# Patient Record
Sex: Female | Born: 1945 | Race: Black or African American | Hispanic: No | Marital: Single | Smoking: Former smoker
Health system: Southern US, Community
[De-identification: ages and names within clinical notes are randomized; demographics above are authoritative.]

---

## 2002-05-12 ENCOUNTER — Encounter: Payer: Self-pay | Admitting: Internal Medicine

## 2002-05-13 ENCOUNTER — Observation Stay (HOSPITAL_COMMUNITY): Admission: EM | Admit: 2002-05-13 | Discharge: 2002-05-14 | Payer: Self-pay | Admitting: Emergency Medicine

## 2002-05-14 ENCOUNTER — Encounter: Payer: Self-pay | Admitting: *Deleted

## 2006-12-11 ENCOUNTER — Other Ambulatory Visit: Admission: RE | Admit: 2006-12-11 | Discharge: 2006-12-11 | Payer: Self-pay | Admitting: Family Medicine

## 2007-01-11 ENCOUNTER — Encounter: Admission: RE | Admit: 2007-01-11 | Discharge: 2007-01-11 | Payer: Self-pay | Admitting: Family Medicine

## 2008-03-11 ENCOUNTER — Emergency Department (HOSPITAL_COMMUNITY): Admission: EM | Admit: 2008-03-11 | Discharge: 2008-03-11 | Payer: Self-pay | Admitting: Emergency Medicine

## 2010-12-23 ENCOUNTER — Emergency Department (HOSPITAL_COMMUNITY)
Admission: EM | Admit: 2010-12-23 | Discharge: 2010-12-23 | Payer: Self-pay | Source: Home / Self Care | Admitting: Emergency Medicine

## 2010-12-25 ENCOUNTER — Encounter: Payer: Self-pay | Admitting: Family Medicine

## 2011-04-21 NOTE — Discharge Summary (Signed)
Shirley Jackson  Patient:    Shirley Jackson, Shirley Jackson Visit Number: [id number] MRN: [id number]          Service Type: MED Location: 3W 0377 01 Attending Physician:  Shirley Jackson Dictated by:   Shirley Jackson, M.D. Admit Date:  05/12/2002 Discharge Date: 05/14/2002   CC:         Shirley Jackson, M.D.  Shirley Jackson Shirley Jackson, M.D.   Discharge Summary  DATE OF BIRTH:  March 27, 1946.  DISCHARGE DIAGNOSES: 1. Atypical chest pain.    a. Stress Cardiolite no evidence of ischemia.    b. Possible gastroesophageal reflux symptoms. 2. Borderline hypertension.    a. Follow as an outpatient. 3. Chronic tobacco use. 4. Possible chronic obstructive pulmonary disease.    a. No formal pulmonary function tests. 5. Abnormal nodules on chest x-ray.    a. Followup CT: Probable benign right third rib abnormality (see       discussion). 6. Postmenopausal.  DISCHARGE MEDICATIONS: 1. Prempro daily. 2. (NEW) Protonix 40 mg daily x1 month. 3. (NEW) Combivent meaty with spacer two puffs t.i.d. 4. (NEW) Nicotine patch 14 mg daily for 2 weeks then 7 mg daily for 2 weeks    then step (if you smoke remove the patch).  CONDITION UPON DISCHARGE:  Stable.  Blood pressure 135/91, heart rate 80, respiratory rate 16, oxygen saturations 98% on room air.  DISPOSITION:  Home.  RECOMMENDED ACTIVITY:  As tolerated.  RECOMMENDED DIET:  Low cholesterol.  Low salt.  SPECIAL INSTRUCTIONS:  Call or return if you have problems.  FOLLOWUP:  Dr. Maurice Jackson on Wednesday, May 21, 2002 at 10:40 a.m.Marland Kitchen  At that visit she will follow up on the patients TSH and also the chest CT done prior to admission.  CONSULTANT:  Cardiology - Shirley Jackson.  PROCEDURES: 1. Stress Cardiolite: No evidence of ischemia. 2. Chest x-ray (May 12, 2002): Borderline heart size without acute abnormality    of the chest.  Density on the right is noted with comparison to any prior    films suggested.  If these are not  available followup examination, possibly    including a limited CT might be helpful. 3. Limited chest CT (May 14, 2002): Three millimeter cuts were obtained    through the abnormal region.  There were no lung lesions.  There is,    however, a Jackson lesion of the anterior third rib seen on cuts 41-44.  The    lesion is approximately 1 cm in size.  It has heavily calcified components    and a Jackson soft tissue component.  This is likely a benign lesion such as    an osteochondroma.  However, I think it would be prudent to do a limited    CT followup in about 3 months to make sure it is not enlarging. 4. EKG: Normal sinus rhythm.  Left atrial enlargement.  Incomplete right    bundle branch block.  Nonspecific T-wave abnormality.  Jackson COURSE: #1 - ATYPICAL CHEST PAIN:  The patient is a 65 year old African-American female who presents with a 1-day history of substernal burning chest pain.  It seemed to be somewhat typical in that it was relieved with rest and started hurting while she was active.  She was admitted to the Jackson under rule out protocol and serial enzymes were obtained.  EKG was not normal as described above.  Serial cardiac enzymes were negative.  I did ask Dr. Fraser Jackson to see the patient.  A  stress Cardiolite was performed which showed no evidence of ischemia.  It is possible that this pain represents gastroesophageal reflux symptoms.  We have put her on a proton pump inhibitor and she will follow up with her regular physician.  #2 - BORDERLINE HYPERTENSION:  Follow up with Dr. Valentina Jackson.  #3 - ABNORMAL CHEST X-RAY:  A limited CT was done as above.  Recommended repeat in 3 months.  As stated above, it is suspected that this is a benign lesion but repeat CT scan is to ensure that there is no enlargement.  #4 - TOBACCO USE WITH PROBABLE CHRONIC OBSTRUCTIVE PULMONARY DISEASE:  I did start her on a Combivent inhaler and counseled on smoking cessation.  DISCHARGE AND  OTHER PERTINENT LABORATORIES:  Comprehensive metabolic panel normal.  Hemoglobin 12.9, WBC 5000, platelet count 287.  Total cholesterol 207.  Triglyceride 117, HDL 54, LDL 129.  TSH 1.129 (normal). Dictated by:   Shirley Jackson, M.D. Attending Physician:  Shirley Jackson DD:  06/04/02 TD:  06/07/02 Job: [id number]

## 2011-04-21 NOTE — Consult Note (Signed)
[facility]  Patient:    Shirley Jackson, Shirley Jackson Visit Number: [id number] MRN: [id number]          Service Type: MED Location: 3W 0377 01 Attending Physician:  Anastasio Auerbach Dictated by:   Meade Maw, M.D. Proc. Date: 05/13/02 Admit Date:  05/12/2002 Discharge Date: 05/14/2002   CC:         Anastasio Auerbach, M.D.  Shirley Jackson, M.D.   Consultation Report  REFERRING PHYSICIAN:  Anastasio Auerbach, M.D.  INDICATION FOR CONSULTATION:  Chest pain.  HISTORY OF PRESENT ILLNESS:  Shirley Jackson is a 65 year old African-American female, who noted an episode of chest pain/burning after walking for approximately one-half mile at work.  The chest pain itself resolved in approximately 10 minutes spontaneously, but she was left with a residual burning and not feeling quite right.  This prompted her to come to the emergency room for further evaluation.  Her associated symptoms included diaphoresis, nausea, but no vomiting, no shortness of breath.  The pain was nonradiating.  Her coronary risk factors are significant for age, tobacco use, family history. Cholesterol status is unknown.  PAST MEDICAL HISTORY:  Migraines.  MEDICATIONS PRIOR TO ARRIVAL:  Prempro and Excedrin.  CURRENT MEDICATIONS: 1. Nicotine patch. 2. Protonix 40 mg q.d. 3. Maalox 30 cc p.o. q.6h. 4. Sublingual nitroglycerin. 5. Prempro. 6. Baby aspirin.  ALLERGIES:  None.  PAST SURGICAL HISTORY:  None.  SOCIAL HISTORY:  She has been widowed for approximately 12 years.  She lives with her daughter, works for a [employer] which requires much reaching, extension, and bending.  She continues to smoke approximately one-half pack-per-day.  No history of alcohol or illicit drug use.  FAMILY HISTORY:  Significant for brother with coronary artery disease in his early 33s.  REVIEW OF SYSTEMS:  There has been no orthopnea, no tachyarrhythmia, no PND, no palpitations, no pedal edema, no fevers, no  chills.  PHYSICAL EXAMINATION:  GENERAL:  A middle-aged, African-American female in no acute distress, pleasant.  Shirley Jackson is appropriate.  VITAL SIGNS:  Initial blood pressure on admission was 162/77.  Blood pressure this a.m. is 156/76.  Heart rate is 60-70.  She is afebrile.  O2 saturation is 99% on room air.  HEENT:  Unremarkable.  NECK:  She has good carotid upstrokes.  No carotid bruits.  Thyroid is not palpable.  PULMONARY:  Breath sounds are equal and clear to auscultation.  Negative use of accessory muscles.  CARDIOVASCULAR:  Normal S1, normal S2, regular rate and rhythm, no rubs, murmurs, or gallops are noted.  ABDOMEN:  Soft, nontender.  No unusual bruits or pulsations are noted.  EXTREMITIES:  No peripheral edema.  SKIN:  Warm and dry.  NEUROLOGIC:  Nonfocal throughout.  Motor is 5/5.  LABORATORY DATA:  Her initial ECG reveals a sinus rhythm with nonspecific ST changes.  Anterior ventricular conduction delay consistent with a right bundle-branch block.  ECG this a.m. reveals a normal sinus rhythm.  There are T waves noted in the precordial leads.  Her serial cardiac enzymes are negative.  Her white count is 12.3, hematocrit 37, platelet count 273.  Potassium 3.5, creatinine .9.  Her PT was 12.2.  Chest x-ray reveals heart size at upper limits of normal, questionable density in the right middle lobe.  No evidence of pulmonary edema.  IMPRESSION: 52. A 64 year old, African-American female, with significant risk factors for    coronary artery disease.  She has nonspecific ST changes noted.  The T wave  changes are more prominent this morning.  Her serial cardiac enzymes are    negative.  In view of her risk factors and ECG, would proceed with stress    Cardiolite for further evaluation.  I agree with your current plan of care.    Lovenox has been added to her regimen in view of her intermediate risk    status. 2. Tobacco abuse.  Smoking cessation was  discussed at length with the patient.    She is aware of the comorbid association with ongoing tobacco use. 3. Right middle lobe density.  In view of her tobacco history, may want to    consider a CT scan.  Further recommendations will be pending the outcome of    her stress Cardiolite.  Thank you for allowing me to participate in the care of your patient. Dictated by:   Meade Maw, M.D. Attending Physician:  Anastasio Auerbach DD:  05/13/02 TD:  05/15/02 Job: [id number]

## 2015-04-05 DIAGNOSIS — E780 Pure hypercholesterolemia: Secondary | ICD-10-CM | POA: Diagnosis not present

## 2015-04-05 DIAGNOSIS — M150 Primary generalized (osteo)arthritis: Secondary | ICD-10-CM | POA: Diagnosis not present

## 2015-04-05 DIAGNOSIS — I10 Essential (primary) hypertension: Secondary | ICD-10-CM | POA: Diagnosis not present

## 2015-04-05 DIAGNOSIS — J301 Allergic rhinitis due to pollen: Secondary | ICD-10-CM | POA: Diagnosis not present

## 2015-04-05 DIAGNOSIS — J309 Allergic rhinitis, unspecified: Secondary | ICD-10-CM | POA: Diagnosis not present

## 2015-04-05 DIAGNOSIS — R739 Hyperglycemia, unspecified: Secondary | ICD-10-CM | POA: Diagnosis not present

## 2015-05-04 ENCOUNTER — Other Ambulatory Visit: Payer: Self-pay | Admitting: Family Medicine

## 2015-05-04 ENCOUNTER — Ambulatory Visit
Admission: RE | Admit: 2015-05-04 | Discharge: 2015-05-04 | Disposition: A | Discharge status: Home / Self Care | Payer: Commercial Managed Care - HMO | Source: Ambulatory Visit | Attending: Family Medicine | Admitting: Family Medicine

## 2015-05-04 DIAGNOSIS — R0781 Pleurodynia: Secondary | ICD-10-CM | POA: Diagnosis not present

## 2015-05-04 DIAGNOSIS — M546 Pain in thoracic spine: Secondary | ICD-10-CM

## 2015-10-11 DIAGNOSIS — E7800 Pure hypercholesterolemia, unspecified: Secondary | ICD-10-CM | POA: Diagnosis not present

## 2015-10-11 DIAGNOSIS — M150 Primary generalized (osteo)arthritis: Secondary | ICD-10-CM | POA: Diagnosis not present

## 2015-10-11 DIAGNOSIS — Z23 Encounter for immunization: Secondary | ICD-10-CM | POA: Diagnosis not present

## 2015-10-11 DIAGNOSIS — I10 Essential (primary) hypertension: Secondary | ICD-10-CM | POA: Diagnosis not present

## 2015-10-11 DIAGNOSIS — J309 Allergic rhinitis, unspecified: Secondary | ICD-10-CM | POA: Diagnosis not present

## 2015-10-11 DIAGNOSIS — L309 Dermatitis, unspecified: Secondary | ICD-10-CM | POA: Diagnosis not present

## 2016-04-10 DIAGNOSIS — M150 Primary generalized (osteo)arthritis: Secondary | ICD-10-CM | POA: Diagnosis not present

## 2016-04-10 DIAGNOSIS — L309 Dermatitis, unspecified: Secondary | ICD-10-CM | POA: Diagnosis not present

## 2016-04-10 DIAGNOSIS — I10 Essential (primary) hypertension: Secondary | ICD-10-CM | POA: Diagnosis not present

## 2016-04-10 DIAGNOSIS — E7800 Pure hypercholesterolemia, unspecified: Secondary | ICD-10-CM | POA: Diagnosis not present

## 2016-10-13 DIAGNOSIS — M150 Primary generalized (osteo)arthritis: Secondary | ICD-10-CM | POA: Diagnosis not present

## 2016-10-13 DIAGNOSIS — I10 Essential (primary) hypertension: Secondary | ICD-10-CM | POA: Diagnosis not present

## 2016-10-13 DIAGNOSIS — E7800 Pure hypercholesterolemia, unspecified: Secondary | ICD-10-CM | POA: Diagnosis not present

## 2017-05-15 DIAGNOSIS — E7800 Pure hypercholesterolemia, unspecified: Secondary | ICD-10-CM | POA: Diagnosis not present

## 2017-05-15 DIAGNOSIS — M150 Primary generalized (osteo)arthritis: Secondary | ICD-10-CM | POA: Diagnosis not present

## 2017-05-15 DIAGNOSIS — H6120 Impacted cerumen, unspecified ear: Secondary | ICD-10-CM | POA: Diagnosis not present

## 2017-05-15 DIAGNOSIS — L309 Dermatitis, unspecified: Secondary | ICD-10-CM | POA: Diagnosis not present

## 2017-05-15 DIAGNOSIS — I10 Essential (primary) hypertension: Secondary | ICD-10-CM | POA: Diagnosis not present

## 2017-12-11 DIAGNOSIS — L309 Dermatitis, unspecified: Secondary | ICD-10-CM | POA: Diagnosis not present

## 2017-12-11 DIAGNOSIS — I10 Essential (primary) hypertension: Secondary | ICD-10-CM | POA: Diagnosis not present

## 2017-12-11 DIAGNOSIS — M150 Primary generalized (osteo)arthritis: Secondary | ICD-10-CM | POA: Diagnosis not present

## 2017-12-11 DIAGNOSIS — E7800 Pure hypercholesterolemia, unspecified: Secondary | ICD-10-CM | POA: Diagnosis not present

## 2018-06-10 DIAGNOSIS — M150 Primary generalized (osteo)arthritis: Secondary | ICD-10-CM | POA: Diagnosis not present

## 2018-06-10 DIAGNOSIS — E7800 Pure hypercholesterolemia, unspecified: Secondary | ICD-10-CM | POA: Diagnosis not present

## 2018-06-10 DIAGNOSIS — I10 Essential (primary) hypertension: Secondary | ICD-10-CM | POA: Diagnosis not present

## 2018-06-10 DIAGNOSIS — L309 Dermatitis, unspecified: Secondary | ICD-10-CM | POA: Diagnosis not present

## 2018-08-20 DIAGNOSIS — M1711 Unilateral primary osteoarthritis, right knee: Secondary | ICD-10-CM | POA: Diagnosis not present

## 2018-08-20 DIAGNOSIS — M1712 Unilateral primary osteoarthritis, left knee: Secondary | ICD-10-CM | POA: Diagnosis not present

## 2018-09-09 DIAGNOSIS — J069 Acute upper respiratory infection, unspecified: Secondary | ICD-10-CM | POA: Diagnosis not present

## 2018-12-20 DIAGNOSIS — E7800 Pure hypercholesterolemia, unspecified: Secondary | ICD-10-CM | POA: Diagnosis not present

## 2018-12-20 DIAGNOSIS — I10 Essential (primary) hypertension: Secondary | ICD-10-CM | POA: Diagnosis not present

## 2018-12-20 DIAGNOSIS — L309 Dermatitis, unspecified: Secondary | ICD-10-CM | POA: Diagnosis not present

## 2018-12-20 DIAGNOSIS — M150 Primary generalized (osteo)arthritis: Secondary | ICD-10-CM | POA: Diagnosis not present

## 2018-12-26 DIAGNOSIS — H5210 Myopia, unspecified eye: Secondary | ICD-10-CM | POA: Diagnosis not present

## 2019-06-11 DIAGNOSIS — E7800 Pure hypercholesterolemia, unspecified: Secondary | ICD-10-CM | POA: Diagnosis not present

## 2019-06-11 DIAGNOSIS — I10 Essential (primary) hypertension: Secondary | ICD-10-CM | POA: Diagnosis not present

## 2019-06-11 DIAGNOSIS — L309 Dermatitis, unspecified: Secondary | ICD-10-CM | POA: Diagnosis not present

## 2019-06-11 DIAGNOSIS — M150 Primary generalized (osteo)arthritis: Secondary | ICD-10-CM | POA: Diagnosis not present

## 2019-06-12 DIAGNOSIS — E7800 Pure hypercholesterolemia, unspecified: Secondary | ICD-10-CM | POA: Diagnosis not present

## 2019-07-12 DIAGNOSIS — Z20828 Contact with and (suspected) exposure to other viral communicable diseases: Secondary | ICD-10-CM | POA: Diagnosis not present

## 2019-11-08 DIAGNOSIS — M25562 Pain in left knee: Secondary | ICD-10-CM | POA: Diagnosis not present

## 2019-11-22 DIAGNOSIS — Z20828 Contact with and (suspected) exposure to other viral communicable diseases: Secondary | ICD-10-CM | POA: Diagnosis not present

## 2019-12-09 DIAGNOSIS — I10 Essential (primary) hypertension: Secondary | ICD-10-CM | POA: Diagnosis not present

## 2019-12-09 DIAGNOSIS — M150 Primary generalized (osteo)arthritis: Secondary | ICD-10-CM | POA: Diagnosis not present

## 2019-12-09 DIAGNOSIS — E7800 Pure hypercholesterolemia, unspecified: Secondary | ICD-10-CM | POA: Diagnosis not present

## 2019-12-09 DIAGNOSIS — L309 Dermatitis, unspecified: Secondary | ICD-10-CM | POA: Diagnosis not present

## 2019-12-09 DIAGNOSIS — D179 Benign lipomatous neoplasm, unspecified: Secondary | ICD-10-CM | POA: Diagnosis not present

## 2019-12-31 DIAGNOSIS — R519 Headache, unspecified: Secondary | ICD-10-CM | POA: Diagnosis not present

## 2020-02-11 DIAGNOSIS — M1711 Unilateral primary osteoarthritis, right knee: Secondary | ICD-10-CM | POA: Diagnosis not present

## 2020-04-15 DIAGNOSIS — M25561 Pain in right knee: Secondary | ICD-10-CM | POA: Diagnosis not present

## 2020-04-15 DIAGNOSIS — M25562 Pain in left knee: Secondary | ICD-10-CM | POA: Diagnosis not present

## 2020-04-27 DIAGNOSIS — M25562 Pain in left knee: Secondary | ICD-10-CM | POA: Diagnosis not present

## 2020-05-04 DIAGNOSIS — S83281A Other tear of lateral meniscus, current injury, right knee, initial encounter: Secondary | ICD-10-CM | POA: Diagnosis not present

## 2020-05-14 DIAGNOSIS — M11262 Other chondrocalcinosis, left knee: Secondary | ICD-10-CM | POA: Diagnosis not present

## 2020-05-14 DIAGNOSIS — M23262 Derangement of other lateral meniscus due to old tear or injury, left knee: Secondary | ICD-10-CM | POA: Diagnosis not present

## 2020-05-14 DIAGNOSIS — G8918 Other acute postprocedural pain: Secondary | ICD-10-CM | POA: Diagnosis not present

## 2020-05-14 DIAGNOSIS — M23232 Derangement of other medial meniscus due to old tear or injury, left knee: Secondary | ICD-10-CM | POA: Diagnosis not present

## 2020-05-14 DIAGNOSIS — S83281A Other tear of lateral meniscus, current injury, right knee, initial encounter: Secondary | ICD-10-CM | POA: Diagnosis not present

## 2020-06-08 DIAGNOSIS — E7800 Pure hypercholesterolemia, unspecified: Secondary | ICD-10-CM | POA: Diagnosis not present

## 2020-06-08 DIAGNOSIS — I10 Essential (primary) hypertension: Secondary | ICD-10-CM | POA: Diagnosis not present

## 2020-06-08 DIAGNOSIS — M25562 Pain in left knee: Secondary | ICD-10-CM | POA: Diagnosis not present

## 2020-06-08 DIAGNOSIS — M150 Primary generalized (osteo)arthritis: Secondary | ICD-10-CM | POA: Diagnosis not present

## 2020-06-08 DIAGNOSIS — K219 Gastro-esophageal reflux disease without esophagitis: Secondary | ICD-10-CM | POA: Diagnosis not present

## 2020-11-25 DIAGNOSIS — Z03818 Encounter for observation for suspected exposure to other biological agents ruled out: Secondary | ICD-10-CM | POA: Diagnosis not present

## 2020-11-25 DIAGNOSIS — Z20822 Contact with and (suspected) exposure to covid-19: Secondary | ICD-10-CM | POA: Diagnosis not present

## 2020-12-09 DIAGNOSIS — J309 Allergic rhinitis, unspecified: Secondary | ICD-10-CM | POA: Diagnosis not present

## 2020-12-09 DIAGNOSIS — I10 Essential (primary) hypertension: Secondary | ICD-10-CM | POA: Diagnosis not present

## 2020-12-09 DIAGNOSIS — M150 Primary generalized (osteo)arthritis: Secondary | ICD-10-CM | POA: Diagnosis not present

## 2020-12-09 DIAGNOSIS — E7800 Pure hypercholesterolemia, unspecified: Secondary | ICD-10-CM | POA: Diagnosis not present

## 2020-12-09 DIAGNOSIS — D179 Benign lipomatous neoplasm, unspecified: Secondary | ICD-10-CM | POA: Diagnosis not present

## 2021-05-05 DIAGNOSIS — H5713 Ocular pain, bilateral: Secondary | ICD-10-CM | POA: Diagnosis not present

## 2021-05-10 DIAGNOSIS — H539 Unspecified visual disturbance: Secondary | ICD-10-CM | POA: Diagnosis not present

## 2021-05-25 ENCOUNTER — Other Ambulatory Visit: Payer: Self-pay | Admitting: Family Medicine

## 2021-05-25 DIAGNOSIS — H5310 Unspecified subjective visual disturbances: Secondary | ICD-10-CM

## 2021-05-25 DIAGNOSIS — R42 Dizziness and giddiness: Secondary | ICD-10-CM

## 2021-06-03 ENCOUNTER — Ambulatory Visit
Admission: RE | Admit: 2021-06-03 | Discharge: 2021-06-03 | Disposition: A | Discharge status: Home / Self Care | Payer: Medicare HMO | Source: Ambulatory Visit | Attending: Family Medicine | Admitting: Family Medicine

## 2021-06-03 DIAGNOSIS — R0989 Other specified symptoms and signs involving the circulatory and respiratory systems: Secondary | ICD-10-CM | POA: Diagnosis not present

## 2021-06-03 DIAGNOSIS — I6523 Occlusion and stenosis of bilateral carotid arteries: Secondary | ICD-10-CM | POA: Diagnosis not present

## 2021-06-03 DIAGNOSIS — E782 Mixed hyperlipidemia: Secondary | ICD-10-CM | POA: Diagnosis not present

## 2021-06-03 DIAGNOSIS — R42 Dizziness and giddiness: Secondary | ICD-10-CM

## 2021-06-03 DIAGNOSIS — H5310 Unspecified subjective visual disturbances: Secondary | ICD-10-CM

## 2021-07-01 DIAGNOSIS — D179 Benign lipomatous neoplasm, unspecified: Secondary | ICD-10-CM | POA: Diagnosis not present

## 2021-07-01 DIAGNOSIS — Z0000 Encounter for general adult medical examination without abnormal findings: Secondary | ICD-10-CM | POA: Diagnosis not present

## 2021-07-01 DIAGNOSIS — Z111 Encounter for screening for respiratory tuberculosis: Secondary | ICD-10-CM | POA: Diagnosis not present

## 2021-07-01 DIAGNOSIS — I10 Essential (primary) hypertension: Secondary | ICD-10-CM | POA: Diagnosis not present

## 2021-07-01 DIAGNOSIS — E7800 Pure hypercholesterolemia, unspecified: Secondary | ICD-10-CM | POA: Diagnosis not present

## 2021-07-01 DIAGNOSIS — J309 Allergic rhinitis, unspecified: Secondary | ICD-10-CM | POA: Diagnosis not present

## 2021-07-01 DIAGNOSIS — M150 Primary generalized (osteo)arthritis: Secondary | ICD-10-CM | POA: Diagnosis not present

## 2021-08-24 DIAGNOSIS — H5212 Myopia, left eye: Secondary | ICD-10-CM | POA: Diagnosis not present

## 2021-11-04 DIAGNOSIS — B9689 Other specified bacterial agents as the cause of diseases classified elsewhere: Secondary | ICD-10-CM | POA: Diagnosis not present

## 2021-11-04 DIAGNOSIS — J019 Acute sinusitis, unspecified: Secondary | ICD-10-CM | POA: Diagnosis not present

## 2021-11-04 DIAGNOSIS — H00014 Hordeolum externum left upper eyelid: Secondary | ICD-10-CM | POA: Diagnosis not present

## 2022-01-25 ENCOUNTER — Other Ambulatory Visit: Payer: Self-pay | Admitting: Family Medicine

## 2022-01-25 ENCOUNTER — Ambulatory Visit
Admission: RE | Admit: 2022-01-25 | Discharge: 2022-01-25 | Disposition: A | Discharge status: Home / Self Care | Payer: Medicare HMO | Source: Ambulatory Visit | Attending: Family Medicine | Admitting: Family Medicine

## 2022-01-25 DIAGNOSIS — E7800 Pure hypercholesterolemia, unspecified: Secondary | ICD-10-CM | POA: Diagnosis not present

## 2022-01-25 DIAGNOSIS — I10 Essential (primary) hypertension: Secondary | ICD-10-CM | POA: Diagnosis not present

## 2022-01-25 DIAGNOSIS — I700 Atherosclerosis of aorta: Secondary | ICD-10-CM | POA: Diagnosis not present

## 2022-01-25 DIAGNOSIS — M150 Primary generalized (osteo)arthritis: Secondary | ICD-10-CM | POA: Diagnosis not present

## 2022-01-25 DIAGNOSIS — R0789 Other chest pain: Secondary | ICD-10-CM

## 2022-01-25 DIAGNOSIS — R0602 Shortness of breath: Secondary | ICD-10-CM | POA: Diagnosis not present

## 2022-01-25 DIAGNOSIS — R918 Other nonspecific abnormal finding of lung field: Secondary | ICD-10-CM | POA: Diagnosis not present

## 2022-01-25 DIAGNOSIS — J309 Allergic rhinitis, unspecified: Secondary | ICD-10-CM | POA: Diagnosis not present

## 2022-02-06 ENCOUNTER — Other Ambulatory Visit: Payer: Self-pay

## 2022-02-06 ENCOUNTER — Encounter: Payer: Self-pay | Admitting: Internal Medicine

## 2022-02-06 ENCOUNTER — Ambulatory Visit: Payer: Medicare HMO | Admitting: Internal Medicine

## 2022-02-06 VITALS — BP 122/70 | HR 82 | Ht 67.5 in | Wt 162.0 lb

## 2022-02-06 DIAGNOSIS — R072 Precordial pain: Secondary | ICD-10-CM

## 2022-02-06 DIAGNOSIS — R079 Chest pain, unspecified: Secondary | ICD-10-CM | POA: Diagnosis not present

## 2022-02-06 DIAGNOSIS — E785 Hyperlipidemia, unspecified: Secondary | ICD-10-CM

## 2022-02-06 DIAGNOSIS — I779 Disorder of arteries and arterioles, unspecified: Secondary | ICD-10-CM | POA: Diagnosis not present

## 2022-02-06 DIAGNOSIS — I10 Essential (primary) hypertension: Secondary | ICD-10-CM | POA: Diagnosis not present

## 2022-02-06 MED ORDER — ASPIRIN EC 81 MG PO TBEC: 81.0000 mg | DELAYED_RELEASE_TABLET | Freq: Every day | ORAL | 3 refills | Status: AC

## 2022-02-06 MED ORDER — METOPROLOL TARTRATE 100 MG PO TABS: 100.0000 mg | ORAL_TABLET | Freq: Once | ORAL | 0 refills | Status: DC

## 2022-02-06 NOTE — Progress Notes (Signed)
?Cardiology Office Note:   ? ?Date:  02/06/2022  ? ?ID:  Shirley Jackson, DOB 12/27/1945, MRN [id number] ? ?PCP:  Alroy Dust, L.Marlou Sa, MD  ? ?[facility] ?Cardiologist:  Lenna Sciara, MD ?Referring MD: Aurea Graff.Marlou Sa, MD  ? ?Chief Complaint/Reason for Referral:  Chest pain ? ?ASSESSMENT:   ? ?Chest pain, unspecified type ? ?Bilateral carotid artery disease, unspecified type ([place]) ? ?Hyperlipidemia, unspecified hyperlipidemia type ? ?Hypertension, unspecified type ? ?Precordial pain ? ?PLAN:   ? ?In order of problems listed above: ? ?1.  We will obtain a coronary CTA and echocardiogram to evaluate further.  If the patient has mild obstructive coronary artery disease, they will require a statin (with goal LDL < 70) and aspirin, if they have high-grade disease we will need to consider optimal medical therapy and if symptoms are refractory to medical therapy, then a cardiac catheterization with possible PCI will be pursued to alleviate symptoms.  If they have high risk disease we will proceed directly to cardiac catheterization.  Follow up 6 months or earlier if needed. ?2.  Goal LDL is less than 70, continue aspirin and statin. ?3.  See #2 above. ?4.  Goal blood pressures less than 130/80 mmHg. ?5.  See number 1 above. ? ?     ? ?  ? ?Dispo:  Return in about 6 months (around 08/09/2022).  ?  ? ?Medication Adjustments/Labs and Tests Ordered: ?Current medicines are reviewed at length with the patient today.  Concerns regarding medicines are outlined above.  ? ?Tests Ordered: ?No orders of the defined types were placed in this encounter. ? ? ?Medication Changes: ?No orders of the defined types were placed in this encounter. ? ? ?History of Present Illness:   ? ?FOCUSED PROBLEM LIST:   ?1.  Hypertension ?2.  Hyperlipidemia ?3.  Osteoarthritis ?4.  Mild carotid atherosclerosis on vascular ultrasound 2022 ? ?The patient is a 76 y.o. female with the indicated medical history here for recommendations regarding chest  pain.  The patient was seen by her primary care provider recently and reported chest tightness.  Shortness of breath with exertion of several months duration.  EKG was done which I reviewed which demonstrated sinus rhythm and nonspecific ST and T wave changes. ? ?She tells me that this episode of chest discomfort happened about a week and a half ago.  She typically eats pretty late at night and she tells me she was in bed.  She was not sleeping.  She noticed chest discomfort.  She is unable to describe this to me very well.  She denies any chest discomfort with exertion.  She does get some shortness of breath with exertion at times.  She denies any paroxysmal nocturnal dyspnea, peripheral edema, signs or symptoms stroke, severe bleeding, or presyncope or syncope.  She has required no hospitalizations or emergency room visits. ? ?She did quit smoking about 5 years ago and happy to report she continues to abstain from tobacco. ?    ?  ?Previous Medical History: ?History reviewed. No pertinent past medical history. ? ? ?Current Medications: ?Current Meds  ?Medication Sig  ? amLODipine (NORVASC) 10 MG tablet Take 1 tablet by mouth daily.  ? cetirizine (ZYRTEC) 10 MG tablet Take 1 tablet by mouth daily.  ? diclofenac Sodium (VOLTAREN) 1 % GEL Apply 2 g topically as needed.  ? fluticasone (FLONASE) 50 MCG/ACT nasal spray Place 1 spray into both nostrils as needed.  ? rosuvastatin (CRESTOR) 10 MG tablet Take 1 tablet by mouth  daily.  ? traMADol (ULTRAM) 50 MG tablet Take 1 tablet by mouth daily.  ?  ? ?Allergies:    ?Patient has no allergy information on record.  ? ?Social History:   ?Social History  ? ?Tobacco Use  ? Smoking status: Former  ?  Types: Cigarettes  ? Smokeless tobacco: Never  ?  ? ?Family Hx: ?Family History  ?Problem Relation Age of Onset  ? Stroke Father   ? Heart attack Sister   ? Heart attack Brother   ?  ? ?Review of Systems:   ?Please see the history of present illness.    ?All other systems reviewed  and are negative. ?  ? ? ?EKGs/Labs/Other Test Reviewed:   ? ?EKG: Outside EKG demonstrates normal sinus rhythm with nonspecific ST and T wave changes ? ?Prior CV studies: ? ?Carotid U/S 2022 ?Bilateral carotid atherosclerosis. Negative for stenosis. Degree of ?narrowing less than 50% bilaterally by ultrasound criteria. ? ?Lexiscan 2003 ?CLINICAL DATA:    CHEST PAIN  ?NM MYOCARDIAL PERFUSION STUDY  ?THE PATIENT UNDERWENT A SINGLE DAY TREADMILL STRESS CARDIOLITE STUDY WITH 10.0 MCI OF TC-65M  ?SESTAMIBI INJECTED PRIOR TO REST IMAGES AND 30.0 MCI PRIOR TO STRESS IMAGES.  ?MYOCARDIAL PERFUSION WITH SPECT:    NO REVERSIBLE OR IRREVERSIBLE DEFECTS ARE SEEN TO SUGGEST  ?ISCHEMIA OR INFARCTION.  ?EJECTION FRACTION:    THE EJECTION FRACTION IS 35% WITH END-DIASTOLIC VOLUME OF 69 ML AND END-  ?SYSTOLIC VOLUME OF 30 ML.  ?WALL MOTION ANALYSIS:    THERE IS NORMAL WALL MOTION AND WALL THICKENING IN ALL REGIONS.  ?IMPRESSION  ?NO SIGNIFICANT ABNORMALITY.  EJECTION FRACTION 57%. ? ?Imaging studies that I have independently reviewed today: Carotid u/s ? ?Recent Labs: ?External labs demonstrate a sodium 141 potassium 3.9 BUN 14 creatinine 1.13 glucose 83 cholesterol 134 triglycerides 88 HDL 54 LDL 63 LFTs within normal limits ? ?Risk Assessment/Calculations:   ? ?  ?    ? ?Physical Exam:   ? ?VS:  BP 122/70   Pulse 82   Ht 5' 7.5" (1.715 m)   Wt 162 lb (73.5 kg)   SpO2 96%   BMI 25.00 kg/m?    ?Wt Readings from Last 3 Encounters:  ?02/06/22 162 lb (73.5 kg)  ?  ?GENERAL:  No apparent distress, AOx3 ?HEENT:  No carotid bruits, +2 carotid impulses, no scleral icterus ?CAR: RRR no murmurs, gallops, rubs, or thrills ?RES:  Clear to auscultation bilaterally ?ABD:  Soft, nontender, nondistended, positive bowel sounds x 4 ?VASC:  +2 radial pulses, +2 carotid pulses, palpable pedal pulses ?NEURO:  CN 2-12 grossly intact; motor and sensory grossly intact ?PSYCH:  No active depression or anxiety ?EXT:  No edema, ecchymosis, or  cyanosis ? ?Signed, ?Early Osmond, MD  ?02/06/2022 8:21 AM    ?[facility] ?[place], [place], [place]  [address] ?Phone: [phone]; Fax: [phone]  ? ?Note:  This document was prepared using Dragon voice recognition software and may include unintentional dictation errors. ?

## 2022-02-06 NOTE — Patient Instructions (Signed)
Medication Instructions:  ?Your physician has recommended you make the following change in your medication:  ?1.) start aspirin 81 mg - one tablet daily ? ?*If you need a refill on your cardiac medications before your next appointment, please call your pharmacy* ? ? ?Lab Work: ?None today. ?If you have labs (blood work) drawn today and your tests are completely normal, you will receive your results only by: ?MyChart Message (if you have MyChart) OR ?A paper copy in the mail ?If you have any lab test that is abnormal or we need to change your treatment, we will call you to review the results. ? ? ?Testing/Procedures: ?Cardiac CTA - see instructions below. ? ? ?Follow-Up: ?At [facility], you and your health needs are our priority.  As part of our continuing mission to provide you with exceptional heart care, we have created designated Provider Care Teams.  These Care Teams include your primary Cardiologist (physician) and Advanced Practice Providers (APPs -  Physician Assistants and Nurse Practitioners) who all work together to provide you with the care you need, when you need it. ? ?We recommend signing up for the patient portal called "MyChart".  Sign up information is provided on this After Visit Summary.  MyChart is used to connect with patients for Virtual Visits (Telemedicine).  Patients are able to view lab/test results, encounter notes, upcoming appointments, etc.  Non-urgent messages can be sent to your provider as well.   ?To learn more about what you can do with MyChart, go to [email or web address].   ? ?Your next appointment:   ?6 month(s) ? ?The format for your next appointment:   ?In Person ? ?Provider:   ?Early Osmond, MD   ? ? ?Other Instructions ? ? ?Your cardiac CT will be scheduled at  ?[facility] ?[address] ?[place], [place] [address] ?[phone] ? ? ?Please arrive at the [facility] and Children's Entrance (Entrance C2) of [facility] 30 minutes prior to test  start time. ?You can use the FREE valet parking offered at entrance C (encouraged to control the heart rate for the test)  ?Proceed to the [facility] Radiology Department (first floor) to check-in and test prep. ? ?All radiology patients and guests should use entrance C2 at [facility], accessed from [facility], even though the hospital's physical address listed is [address]. ? ? ? ? ?Please follow these instructions carefully (unless otherwise directed): ? ? ?On the Night Before the Test: ?Be sure to Drink plenty of water. ?Do not consume any caffeinated/decaffeinated beverages or chocolate 12 hours prior to your test. ?Do not take any antihistamines 12 hours prior to your test. ? ?On the Day of the Test: ?Drink plenty of water until 1 hour prior to the test. ?Do not eat any food 4 hours prior to the test. ?You may take your regular medications prior to the test.  ?Take metoprolol (Lopressor) two hours prior to test. ?FEMALES- please wear underwire-free bra if available, avoid dresses & tight clothing ? ?     ?After the Test: ?Drink plenty of water. ?After receiving IV contrast, you may experience a mild flushed feeling. This is normal. ?On occasion, you may experience a mild rash up to 24 hours after the test. This is not dangerous. If this occurs, you can take Benadryl 25 mg and increase your fluid intake. ?If you experience trouble breathing, this can be serious. If it is severe call 911 IMMEDIATELY. If it is mild,  please call our office. ?If you take any of these medications: Glipizide/Metformin, Avandament, Glucavance, please do not take 48 hours after completing test unless otherwise instructed. ? ?We will call to schedule your test 2-4 weeks out understanding that some insurance companies will need an authorization prior to the service being performed.  ? ?For non-scheduling related questions, please contact the cardiac imaging nurse navigator should you have any  questions/concerns: ?Marchia Bond, Cardiac Imaging Nurse Navigator ?Gordy Clement, Cardiac Imaging Nurse Navigator ?[facility] ?Direct Office Dial: [phone]  ? ?For scheduling needs, including cancellations and rescheduling, please call [place], [phone]. ?  ?

## 2022-02-16 ENCOUNTER — Telehealth (HOSPITAL_COMMUNITY): Payer: Self-pay | Admitting: *Deleted

## 2022-02-16 NOTE — Telephone Encounter (Signed)
Reaching out to patient to offer assistance regarding upcoming cardiac imaging study; pt verbalizes understanding of appt date/time, parking situation and where to check in, pre-test NPO status and medications ordered, and verified current allergies; name and call back number provided for further questions should they arise ? ?Zehra Rucci RN Navigator Cardiac Imaging ?[facility] ?[phone] office ?[phone] cell ? ?Patient to take 100mg metoprolol tartrate two hours prior to her cardiac CT scan.  She is aware to arrive at 3:30pm for her 4pm scan. ?

## 2022-02-17 ENCOUNTER — Ambulatory Visit (HOSPITAL_COMMUNITY)
Admission: RE | Admit: 2022-02-17 | Discharge: 2022-02-17 | Disposition: A | Discharge status: Home / Self Care | Payer: Medicare HMO | Source: Ambulatory Visit | Attending: Internal Medicine | Admitting: Internal Medicine

## 2022-02-17 ENCOUNTER — Other Ambulatory Visit: Payer: Self-pay | Admitting: Cardiology

## 2022-02-17 ENCOUNTER — Other Ambulatory Visit: Payer: Self-pay

## 2022-02-17 DIAGNOSIS — R931 Abnormal findings on diagnostic imaging of heart and coronary circulation: Secondary | ICD-10-CM | POA: Diagnosis not present

## 2022-02-17 DIAGNOSIS — E785 Hyperlipidemia, unspecified: Secondary | ICD-10-CM | POA: Diagnosis not present

## 2022-02-17 DIAGNOSIS — I779 Disorder of arteries and arterioles, unspecified: Secondary | ICD-10-CM | POA: Insufficient documentation

## 2022-02-17 DIAGNOSIS — R079 Chest pain, unspecified: Secondary | ICD-10-CM | POA: Diagnosis not present

## 2022-02-17 DIAGNOSIS — I10 Essential (primary) hypertension: Secondary | ICD-10-CM | POA: Insufficient documentation

## 2022-02-17 DIAGNOSIS — R072 Precordial pain: Secondary | ICD-10-CM | POA: Insufficient documentation

## 2022-02-17 DIAGNOSIS — I2510 Atherosclerotic heart disease of native coronary artery without angina pectoris: Secondary | ICD-10-CM | POA: Diagnosis not present

## 2022-02-17 MED ORDER — NITROGLYCERIN 0.4 MG SL SUBL
SUBLINGUAL_TABLET | SUBLINGUAL | Status: AC
  Filled 2022-02-17: qty 2

## 2022-02-17 MED ORDER — IOHEXOL 350 MG/ML SOLN
100.0000 mL | Freq: Once | INTRAVENOUS | Status: AC | PRN
  Administered 2022-02-17: 100 mL via INTRAVENOUS

## 2022-02-17 MED ORDER — NITROGLYCERIN 0.4 MG SL SUBL
0.8000 mg | SUBLINGUAL_TABLET | Freq: Once | SUBLINGUAL | Status: AC
  Administered 2022-02-17: 0.8 mg via SUBLINGUAL

## 2022-02-18 ENCOUNTER — Ambulatory Visit (HOSPITAL_COMMUNITY)
Admission: RE | Admit: 2022-02-18 | Discharge: 2022-02-18 | Disposition: A | Discharge status: Home / Self Care | Payer: Medicare HMO | Source: Ambulatory Visit | Attending: Cardiology | Admitting: Cardiology

## 2022-02-18 DIAGNOSIS — R079 Chest pain, unspecified: Secondary | ICD-10-CM | POA: Diagnosis not present

## 2022-02-18 DIAGNOSIS — I10 Essential (primary) hypertension: Secondary | ICD-10-CM | POA: Diagnosis not present

## 2022-02-18 DIAGNOSIS — R931 Abnormal findings on diagnostic imaging of heart and coronary circulation: Secondary | ICD-10-CM

## 2022-02-18 DIAGNOSIS — I779 Disorder of arteries and arterioles, unspecified: Secondary | ICD-10-CM | POA: Diagnosis not present

## 2022-02-18 DIAGNOSIS — R072 Precordial pain: Secondary | ICD-10-CM | POA: Diagnosis not present

## 2022-02-18 DIAGNOSIS — E785 Hyperlipidemia, unspecified: Secondary | ICD-10-CM | POA: Diagnosis not present

## 2022-02-20 ENCOUNTER — Telehealth: Payer: Self-pay | Admitting: Internal Medicine

## 2022-02-20 DIAGNOSIS — R931 Abnormal findings on diagnostic imaging of heart and coronary circulation: Secondary | ICD-10-CM | POA: Diagnosis not present

## 2022-02-20 DIAGNOSIS — I2510 Atherosclerotic heart disease of native coronary artery without angina pectoris: Secondary | ICD-10-CM | POA: Diagnosis not present

## 2022-02-20 NOTE — Telephone Encounter (Signed)
Called patient and reviewed that her cCTA is being sent for [facility] and she will receive a call with results when this is complete. ?

## 2022-02-20 NOTE — Telephone Encounter (Signed)
Follow Up: ? ? ? ? ?Patient said she saw her CT results on My Chart, She did not understand them, she would like for someone to call and explain her results please. ?

## 2022-02-23 ENCOUNTER — Telehealth: Payer: Self-pay | Admitting: *Deleted

## 2022-02-23 MED ORDER — ROSUVASTATIN CALCIUM 20 MG PO TABS: 20.0000 mg | ORAL_TABLET | Freq: Every day | ORAL | 3 refills | Status: DC

## 2022-02-23 NOTE — Telephone Encounter (Signed)
-----   Message from Early Osmond, MD sent at 02/22/2022 12:39 PM EDT ----- ?I spoke with the patient.  She is having no exertional angina.  We will treat medically with aspirin and statin therapy for now.  If she develops angina we will think about beta-blockade and Imdur.  I will see her back in 6 months.  Please prescribe her Crestor 20 mg daily.  She needs a new prescription. ?

## 2022-02-23 NOTE — Telephone Encounter (Signed)
Crestor refilled.  Appointment for in 6 months. ?

## 2022-07-25 DIAGNOSIS — J309 Allergic rhinitis, unspecified: Secondary | ICD-10-CM | POA: Diagnosis not present

## 2022-07-25 DIAGNOSIS — D179 Benign lipomatous neoplasm, unspecified: Secondary | ICD-10-CM | POA: Diagnosis not present

## 2022-07-25 DIAGNOSIS — I10 Essential (primary) hypertension: Secondary | ICD-10-CM | POA: Diagnosis not present

## 2022-07-25 DIAGNOSIS — M150 Primary generalized (osteo)arthritis: Secondary | ICD-10-CM | POA: Diagnosis not present

## 2022-07-25 DIAGNOSIS — Z0000 Encounter for general adult medical examination without abnormal findings: Secondary | ICD-10-CM | POA: Diagnosis not present

## 2022-07-25 DIAGNOSIS — E7800 Pure hypercholesterolemia, unspecified: Secondary | ICD-10-CM | POA: Diagnosis not present

## 2022-08-22 NOTE — Progress Notes (Unsigned)
Cardiology Office Note:    Date:  08/24/2022   ID:  Shirley Jackson, DOB [birth year], MRN [id number]  PCP:  Shirley Graff.Marlou Sa, MD   [facility] Prov[facility] Cardiologist:  Shirley Sciara, MD Referring MD: Shirley Graff.Marlou Sa, MD   Chief Complaint/Reason for Referral:  Chest pain  ASSESSMENT:    Coronary artery disease involving native coronary artery of native heart without angina pectoris - Plan: EKG 12-Lead  Bilateral carotid artery disease, unspecified type (Shirley Jackson)  Hyperlipidemia LDL goal <70  Hypertension, unspecified type  Aortic atherosclerosis (HCC)  Stage 3a chronic kidney disease (Shirley Jackson)  PLAN:    In order of problems listed above:  1.  Coronary artery disease: Patient has a hemodynamically significant proximal right coronary artery lesion but he is clinically asymptomatic.  Continue medical therapy for now with aspirin and Crestor.  The patient is having no exertional angina.  We will give a prescription for as needed nitroglycerin.  If needed in the future we will add beta-blocker and Imdur.  Follow-up in 6 months or earlier if needed. 2.  Carotid disease: Continue aspirin and statin. 3.  Hyperlipidemia: LDL and LFTs checked recently were at goal with an LDL of 34 in August of this year.  Check Lp(a) today. 4.  Hypertension: Goal blood pressures less than 130/80 mmHg. 5.  Aortic atherosclerosis: Continue aspirin, statin, and blood pressure control. 6.  Stage III chronic kidney disease: Continue strict blood pressure control   Dispo:  Return in about 6 months (around 02/22/2023).     Medication Adjustments/Labs and Tests Ordered: Current medicines are reviewed at length with the patient today.  Concerns regarding medicines are outlined above.   Tests Ordered: Orders Placed This Encounter  Procedures   EKG 12-Lead    Medication Changes: No orders of the defined types were placed in this encounter.   History of Present Illness:    FOCUSED PROBLEM LIST:   1.   Hypertension 2.  Hyperlipidemia 3.  Osteoarthritis 4.  Mild carotid atherosclerosis on vascular ultrasound 2022 5.  Aortic atherosclerosis on CT scan 2023 6.  Chronic kidney disease stage III 7.  Coronary CTA 2020 with FFR+ proximal right coronary artery lesion; patient without symptoms and medical therapy being pursued  March 2023: The patient is a 76 y.o. female with the indicated medical history here for recommendations regarding chest pain.  The patient was seen by her primary care provider recently and reported chest tightness.  Shortness of breath with exertion of several months duration.  EKG was done which I reviewed which demonstrated sinus rhythm and nonspecific ST and T wave changes.  She tells me that this episode of chest discomfort happened about a week and a half ago.  She typically eats pretty late at night and she tells me she was in bed.  She was not sleeping.  She noticed chest discomfort.  She is unable to describe this to me very well.  She denies any chest discomfort with exertion.  She does get some shortness of breath with exertion at times.  She denies any paroxysmal nocturnal dyspnea, peripheral edema, signs or symptoms stroke, severe bleeding, or presyncope or syncope.  She has required no hospitalizations or emergency room visits.  She did quit smoking about 5 years ago and happy to report she continues to abstain from tobacco.  Plan: Obtain echocardiogram and coronary CTA.  Today: In the interim the patient's coronary CTA demonstrated a proximal right coronary artery lesion.  She was not having any angina so medical  therapy was pursued; aspirin and Crestor 20 mg were started.  She denies any exertional angina or exertional dyspnea.  She had an episode of right shoulder pain from arthritis that resolved with pain medications.  She is required no emergency room visits or hospitalizations.  She generally feels well.     Current Medications: Current Meds  Medication Sig    amLODipine (NORVASC) 10 MG tablet Take 1 tablet by mouth daily.   aspirin EC 81 MG tablet Take 1 tablet (81 mg total) by mouth daily. Swallow whole.   cetirizine (ZYRTEC) 10 MG tablet Take 1 tablet by mouth daily.   diclofenac Sodium (VOLTAREN) 1 % GEL Apply 2 g topically as needed.   fluticasone (FLONASE) 50 MCG/ACT nasal spray Place 1 spray into both nostrils as needed.   rosuvastatin (CRESTOR) 20 MG tablet Take 1 tablet (20 mg total) by mouth daily.   traMADol (ULTRAM) 50 MG tablet Take 1 tablet by mouth daily.     Allergies:    Rosuvastatin calcium   Social History:   Social History   Tobacco Use   Smoking status: Former    Types: Cigarettes   Smokeless tobacco: Never     Family Hx: Family History  Problem Relation Age of Onset   Stroke Father    Heart attack Sister    Heart attack Brother      Review of Systems:   Please see the history of present illness.    All other systems reviewed and are negative.     EKGs/Labs/Other Test Reviewed:    EKG: Outside EKG demonstrates normal sinus rhythm with nonspecific ST and T wave changes; EKG done today that I personally reviewed demonstrates normal sinus rhythm with possible left ventricular hypertrophy and nonspecific ST-T wave changes.  Prior CV studies:  CTA 2023: 1. Coronary CTA FFR analysis demonstrates probable flow limiting lesion in the proximal RCA (FFR 0.99>>0.76>>0.65).    Carotid U/S 2022 Bilateral carotid atherosclerosis. Negative for stenosis. Degree of narrowing less than 50% bilaterally by ultrasound criteria.  Lexiscan 2003 Low risk  Imaging studies that I have independently reviewed today: Carotid u/s, CTA with aortic atherosclerosis  Recent Labs: External labs August 2023 demonstrate this is being creatinine of 1.26 with GFR 44; normal LFTs, cholesterol 110, LDL 34  Risk Assessment/Calculations:           Physical Exam:    VS:  BP 124/68   Pulse 67   Ht '5\' 7"'$  (1.702 m)   Wt 158 lb  9.6 oz (71.9 kg)   SpO2 98%   BMI 24.84 kg/m    Wt Readings from Last 3 Encounters:  08/24/22 158 lb 9.6 oz (71.9 kg)  02/06/22 162 lb (73.5 kg)    GENERAL:  No apparent distress, AOx3 HEENT:  No carotid bruits, +2 carotid impulses, no scleral icterus CAR: RRR no murmurs, gallops, rubs, or thrills RES:  Clear to auscultation bilaterally ABD:  Soft, nontender, nondistended, positive bowel sounds x 4 VASC:  +2 radial pulses, +2 carotid pulses, palpable pedal pulses NEURO:  CN 2-12 grossly intact; motor and sensory grossly intact PSYCH:  No active depression or anxiety EXT:  No edema, ecchymosis, or cyanosis  Signed, Early Osmond, MD  08/24/2022 3:00 PM    [address], [place], [place]  [address] Phone: [phone]; Fax: [phone]   Note:  This document was prepared using Dragon voice recognition software and may include unintentional dictation errors.

## 2022-08-24 ENCOUNTER — Ambulatory Visit: Payer: Medicare HMO | Attending: Internal Medicine | Admitting: Internal Medicine

## 2022-08-24 ENCOUNTER — Encounter: Payer: Self-pay | Admitting: Internal Medicine

## 2022-08-24 VITALS — BP 124/68 | HR 67 | Ht 67.0 in | Wt 158.6 lb

## 2022-08-24 DIAGNOSIS — I2510 Atherosclerotic heart disease of native coronary artery without angina pectoris: Secondary | ICD-10-CM | POA: Diagnosis not present

## 2022-08-24 DIAGNOSIS — I779 Disorder of arteries and arterioles, unspecified: Secondary | ICD-10-CM | POA: Diagnosis not present

## 2022-08-24 DIAGNOSIS — E785 Hyperlipidemia, unspecified: Secondary | ICD-10-CM | POA: Diagnosis not present

## 2022-08-24 DIAGNOSIS — I700 Atherosclerosis of aorta: Secondary | ICD-10-CM

## 2022-08-24 DIAGNOSIS — N1831 Chronic kidney disease, stage 3a: Secondary | ICD-10-CM

## 2022-08-24 DIAGNOSIS — I10 Essential (primary) hypertension: Secondary | ICD-10-CM | POA: Diagnosis not present

## 2022-08-24 MED ORDER — NITROGLYCERIN 0.4 MG SL SUBL: 0.4000 mg | SUBLINGUAL_TABLET | SUBLINGUAL | 3 refills | Status: DC | PRN

## 2022-08-24 NOTE — Addendum Note (Signed)
Addended by: Rodman Key on: 08/24/2022 03:03 PM   Modules accepted: Orders

## 2022-08-24 NOTE — Patient Instructions (Signed)
Medication Instructions:  Your physician has recommended you make the following change in your medication:  1.) nitroglycerin 0.4 mg   If a single episode of chest pain is not relieved by one tablet, the patient will try another within 5 minutes; and if this doesn't relieve the pain, the patient will try another within 5 minutes and if this doesn't relieve the pain the patient is instructed to call 911 for transportation to an emergency department.   *If you need a refill on your cardiac medications before your next appointment, please call your pharmacy*   Lab Work: none   Testing/Procedures: none   Follow-Up: At [facility], you and your health needs are our priority.  As part of our continuing mission to provide you with exceptional heart care, we have created designated Provider Care Teams.  These Care Teams include your primary Cardiologist (physician) and Advanced Practice Providers (APPs -  Physician Assistants and Nurse Practitioners) who all work together to provide you with the care you need, when you need it.   Your next appointment:   6 month(s)  The format for your next appointment:   In Person  Provider:   Early Osmond, MD     Important Information About Sugar

## 2022-08-25 DIAGNOSIS — H52223 Regular astigmatism, bilateral: Secondary | ICD-10-CM | POA: Diagnosis not present

## 2023-01-24 DIAGNOSIS — M150 Primary generalized (osteo)arthritis: Secondary | ICD-10-CM | POA: Diagnosis not present

## 2023-01-24 DIAGNOSIS — E7800 Pure hypercholesterolemia, unspecified: Secondary | ICD-10-CM | POA: Diagnosis not present

## 2023-01-24 DIAGNOSIS — L309 Dermatitis, unspecified: Secondary | ICD-10-CM | POA: Diagnosis not present

## 2023-01-24 DIAGNOSIS — J309 Allergic rhinitis, unspecified: Secondary | ICD-10-CM | POA: Diagnosis not present

## 2023-01-24 DIAGNOSIS — N183 Chronic kidney disease, stage 3 unspecified: Secondary | ICD-10-CM | POA: Diagnosis not present

## 2023-01-24 DIAGNOSIS — I10 Essential (primary) hypertension: Secondary | ICD-10-CM | POA: Diagnosis not present

## 2023-01-24 DIAGNOSIS — I2510 Atherosclerotic heart disease of native coronary artery without angina pectoris: Secondary | ICD-10-CM | POA: Diagnosis not present

## 2023-01-24 DIAGNOSIS — I700 Atherosclerosis of aorta: Secondary | ICD-10-CM | POA: Diagnosis not present

## 2023-01-24 DIAGNOSIS — D179 Benign lipomatous neoplasm, unspecified: Secondary | ICD-10-CM | POA: Diagnosis not present

## 2023-02-13 ENCOUNTER — Other Ambulatory Visit: Payer: Self-pay | Admitting: Internal Medicine

## 2023-02-16 NOTE — Progress Notes (Unsigned)
Cardiology Office Note:    Date:  02/20/2023   ID:  Shirley Jackson, DOB [birth year], MRN [id number]  PCP:  Aurea Graff.Marlou Sa, MD   [facility] Prov[facility] Cardiologist:  Lenna Sciara, MD Referring MD: Aurea Graff.Marlou Sa, MD   Chief Complaint/Reason for Referral:  Chest pain  ASSESSMENT:    Coronary artery disease involving native coronary artery of native heart without angina pectoris - Plan: Hepatic function panel, Lipid panel, Lipoprotein A (LPA)  Bilateral carotid artery disease, unspecified type ([place])  Hyperlipidemia LDL goal <70 - Plan: Hepatic function panel, Lipid panel, Lipoprotein A (LPA)  Primary hypertension  Aortic atherosclerosis (HCC)  Stage 3a chronic kidney disease ([place])  PLAN:    In order of problems listed above:  1.  Coronary artery disease: Patient has a hemodynamically significant proximal right coronary artery lesion but he is clinically asymptomatic.  Continue medical therapy for now with aspirin and Crestor.  She has taken nitroglycerin sporadically but this seems to be in response to discomfort from a GI etiology.  She denies any exertional angina. 2.  Carotid disease: Continue aspirin and statin. 3.  Hyperlipidemia: Check lipid panel, LFTs, LP(a) today 4.  Hypertension: Blood pressure is well-controlled today. 5.  Aortic atherosclerosis: Continue aspirin, statin, and blood pressure control. 6.  Stage III chronic kidney disease: Continue strict blood pressure control   Dispo:  Return in about 1 year (around 02/20/2024).     Medication Adjustments/Labs and Tests Ordered: Current medicines are reviewed at length with the patient today.  Concerns regarding medicines are outlined above.   Tests Ordered: Orders Placed This Encounter  Procedures   Hepatic function panel   Lipid panel   Lipoprotein A (LPA)    Medication Changes: No orders of the defined types were placed in this encounter.   History of Present Illness:    FOCUSED PROBLEM LIST:    1.  Hypertension 2.  Hyperlipidemia 3.  Osteoarthritis 4.  Mild carotid atherosclerosis on vascular ultrasound 2022 5.  Aortic atherosclerosis on CT scan 2023 6.  Chronic kidney disease stage III 7.  Coronary CTA 2020 with FFR+ proximal right coronary artery lesion; patient without symptoms and medical therapy being pursued  March 2023: The patient is a 77 y.o. female with the indicated medical history here for recommendations regarding chest pain.  The patient was seen by her primary care provider recently and reported chest tightness.  Shortness of breath with exertion of several months duration.  EKG was done which I reviewed which demonstrated sinus rhythm and nonspecific ST and T wave changes.  She tells me that this episode of chest discomfort happened about a week and a half ago.  She typically eats pretty late at night and she tells me she was in bed.  She was not sleeping.  She noticed chest discomfort.  She is unable to describe this to me very well.  She denies any chest discomfort with exertion.  She does get some shortness of breath with exertion at times.  She denies any paroxysmal nocturnal dyspnea, peripheral edema, signs or symptoms stroke, severe bleeding, or presyncope or syncope.  She has required no hospitalizations or emergency room visits.  She did quit smoking about 5 years ago and happy to report she continues to abstain from tobacco.  Plan: Obtain echocardiogram and coronary CTA.  September 2023: In the interim the patient's coronary CTA demonstrated a proximal right coronary artery lesion.  She was not having any angina so medical therapy was pursued; aspirin and Crestor  20 mg were started.  She denies any exertional angina or exertional dyspnea.  She had an episode of right shoulder pain from arthritis that resolved with pain medications.  She is required no emergency room visits or hospitalizations.  She generally feels well.  Plan: Continue medical therapy and check  LP(a).  Today: The patient continues to do very well.  She walks on a daily basis without any exertional angina.  She occasionally gets atypical chest pain.  It seems to be related to food.  She sometimes takes a nitroglycerin which does relieve the pain.  This happened maybe 4 times in the last 6 months.  She has not required any emergency room visits.     Current Medications: Current Meds  Medication Sig   amLODipine (NORVASC) 10 MG tablet Take 1 tablet by mouth daily.   aspirin EC 81 MG tablet Take 1 tablet (81 mg total) by mouth daily. Swallow whole.   cetirizine (ZYRTEC) 10 MG tablet Take 1 tablet by mouth daily.   diclofenac Sodium (VOLTAREN) 1 % GEL Apply 2 g topically as needed.   fluticasone (FLONASE) 50 MCG/ACT nasal spray Place 1 spray into both nostrils as needed.   nitroGLYCERIN (NITROSTAT) 0.4 MG SL tablet PLACE ONE TABLET UNDER TONGUE EVERY 5 MINUTES AS NEEDED FOR CHEST PAIN. UP TO 3 DOSES. IF 3RD DOSE REQUIRED CALL EMS/911   rosuvastatin (CRESTOR) 20 MG tablet TAKE 1 TABLET(20 MG) BY MOUTH DAILY   traMADol (ULTRAM) 50 MG tablet Take 1 tablet by mouth daily.   triamcinolone cream (KENALOG) 0.1 % as needed.     Allergies:    Rosuvastatin calcium   Social History:   Social History   Tobacco Use   Smoking status: Former    Types: Cigarettes   Smokeless tobacco: Never     Family Hx: Family History  Problem Relation Age of Onset   Stroke Father    Heart attack Sister    Heart attack Brother      Review of Systems:   Please see the history of present illness.    All other systems reviewed and are negative.     EKGs/Labs/Other Test Reviewed:    EKG: Outside EKG demonstrates normal sinus rhythm with nonspecific ST and T wave changes; EKG done today that I personally reviewed demonstrates normal sinus rhythm with possible left ventricular hypertrophy and nonspecific ST-T wave changes.  Prior CV studies:  CTA 2023: 1. Coronary CTA FFR analysis demonstrates  probable flow limiting lesion in the proximal RCA (FFR 0.99>>0.76>>0.65).    Carotid U/S 2022 Bilateral carotid atherosclerosis. Negative for stenosis. Degree of narrowing less than 50% bilaterally by ultrasound criteria.  Lexiscan 2003 Low risk  Imaging studies that I have independently reviewed today: Carotid u/s, CTA with aortic atherosclerosis  Recent Labs: External labs August 2023 demonstrate this is being creatinine of 1.26 with GFR 44; normal LFTs, cholesterol 110, LDL 34  Risk Assessment/Calculations:           Physical Exam:    VS:  BP 122/82   Pulse 92   Ht 5\' 7"  (1.702 m)   Wt 157 lb 9.6 oz (71.5 kg)   SpO2 96%   BMI 24.68 kg/m    Wt Readings from Last 3 Encounters:  02/20/23 157 lb 9.6 oz (71.5 kg)  08/24/22 158 lb 9.6 oz (71.9 kg)  02/06/22 162 lb (73.5 kg)    GENERAL:  No apparent distress, AOx3 HEENT:  No carotid bruits, +2 carotid impulses, no scleral  icterus CAR: RRR no murmurs, gallops, rubs, or thrills RES:  Clear to auscultation bilaterally ABD:  Soft, nontender, nondistended, positive bowel sounds x 4 VASC:  +2 radial pulses, +2 carotid pulses, palpable pedal pulses NEURO:  CN 2-12 grossly intact; motor and sensory grossly intact PSYCH:  No active depression or anxiety EXT:  No edema, ecchymosis, or cyanosis  Signed, Early Osmond, MD  02/20/2023 3:37 PM    [facility] [address], [place], [place]  [address] Phone: [phone]; Fax: [phone]   Note:  This document was prepared using Dragon voice recognition software and may include unintentional dictation errors.

## 2023-02-19 ENCOUNTER — Other Ambulatory Visit: Payer: Self-pay | Admitting: Internal Medicine

## 2023-02-20 ENCOUNTER — Ambulatory Visit: Payer: Medicare HMO | Attending: Internal Medicine | Admitting: Internal Medicine

## 2023-02-20 ENCOUNTER — Encounter: Payer: Self-pay | Admitting: Internal Medicine

## 2023-02-20 VITALS — BP 122/82 | HR 92 | Ht 67.0 in | Wt 157.6 lb

## 2023-02-20 DIAGNOSIS — E785 Hyperlipidemia, unspecified: Secondary | ICD-10-CM | POA: Diagnosis not present

## 2023-02-20 DIAGNOSIS — N1831 Chronic kidney disease, stage 3a: Secondary | ICD-10-CM | POA: Diagnosis not present

## 2023-02-20 DIAGNOSIS — I2510 Atherosclerotic heart disease of native coronary artery without angina pectoris: Secondary | ICD-10-CM | POA: Diagnosis not present

## 2023-02-20 DIAGNOSIS — I10 Essential (primary) hypertension: Secondary | ICD-10-CM | POA: Diagnosis not present

## 2023-02-20 DIAGNOSIS — I779 Disorder of arteries and arterioles, unspecified: Secondary | ICD-10-CM

## 2023-02-20 DIAGNOSIS — I700 Atherosclerosis of aorta: Secondary | ICD-10-CM

## 2023-02-20 NOTE — Patient Instructions (Signed)
Medication Instructions:  No changes *If you need a refill on your cardiac medications before your next appointment, please call your pharmacy*   Lab Work: Today: lipids, liver function and Lp(a)  If you have labs (blood work) drawn today and your tests are completely normal, you will receive your results only by: [place] (if you have MyChart) OR A paper copy in the mail If you have any lab test that is abnormal or we need to change your treatment, we will call you to review the results.   Testing/Procedures: none   Follow-Up: At [facility], you and your health needs are our priority.  As part of our continuing mission to provide you with exceptional heart care, we have created designated Provider Care Teams.  These Care Teams include your primary Cardiologist (physician) and Advanced Practice Providers (APPs -  Physician Assistants and Nurse Practitioners) who all work together to provide you with the care you need, when you need it.   Your next appointment:   12 month(s)  Provider:   Advanced Practice Practitioner (NP or PA-C)

## 2023-02-21 LAB — HEPATIC FUNCTION PANEL
ALT: 22 IU/L (ref 0–32)
Albumin: 4.6 g/dL (ref 3.8–4.8)
Bilirubin Total: 0.7 mg/dL (ref 0.0–1.2)
Bilirubin, Direct: 0.19 mg/dL (ref 0.00–0.40)

## 2023-02-21 LAB — LIPID PANEL: Chol/HDL Ratio: 2.1 ratio (ref 0.0–4.4)

## 2023-02-22 LAB — LIPOPROTEIN A (LPA): Lipoprotein (a): 8.7 nmol/L (ref <75.0)

## 2023-02-22 LAB — LIPID PANEL
Cholesterol, Total: 109 mg/dL (ref 100–199)
HDL: 52 mg/dL (ref >39)
LDL Chol Calc (NIH): 31 mg/dL (ref 0–99)
Triglycerides: 158 mg/dL — ABNORMAL HIGH (ref 0–149)
VLDL Cholesterol Cal: 26 mg/dL (ref 5–40)

## 2023-02-22 LAB — HEPATIC FUNCTION PANEL
AST: 30 IU/L (ref 0–40)
Alkaline Phosphatase: 139 IU/L — ABNORMAL HIGH (ref 44–121)
Total Protein: 7 g/dL (ref 6.0–8.5)

## 2023-05-01 DIAGNOSIS — I10 Essential (primary) hypertension: Secondary | ICD-10-CM | POA: Diagnosis not present

## 2023-05-01 DIAGNOSIS — J309 Allergic rhinitis, unspecified: Secondary | ICD-10-CM | POA: Diagnosis not present

## 2023-05-01 DIAGNOSIS — H6690 Otitis media, unspecified, unspecified ear: Secondary | ICD-10-CM | POA: Diagnosis not present

## 2023-08-02 DIAGNOSIS — I10 Essential (primary) hypertension: Secondary | ICD-10-CM | POA: Diagnosis not present

## 2023-08-14 ENCOUNTER — Other Ambulatory Visit: Payer: Self-pay | Admitting: Internal Medicine

## 2023-09-22 IMAGING — CT CT HEART MORP W/ CTA COR W/ SCORE W/ CA W/CM &/OR W/O CM
4 of 7 series · 8 of 20 positions shown, 9 images · non-contrast
Comparison: 01/25/2022 chest radiograph.

Addendum:
CLINICAL DATA: Chest pain

EXAM:
Cardiac/Coronary CTA
TECHNIQUE: A non-contrast, gated CT scan was obtained with axial slices of 3 mm
through the heart for calcium scoring. Calcium scoring was performed
using the Agatston method. A 120 kV prospective, gated, contrast
cardiac scan was obtained. Gantry rotation speed was 250 msecs and
collimation was 0.6 mm. Two sublingual nitroglycerin tablets (0.8
mg) were given. The 3D data set was reconstructed in 5% intervals of
the 35-75% of the R-R cycle. Diastolic phases were analyzed on a
dedicated workstation using MPR, MIP, and VRT modes. The patient
received 95 cc of contrast.

[Series 6: best diast 72 % · axial · 0.39mm/px · z∈[-28,+31]mm · 2 of 448 slices shown, 3 images]
[im 150/448  vessel]
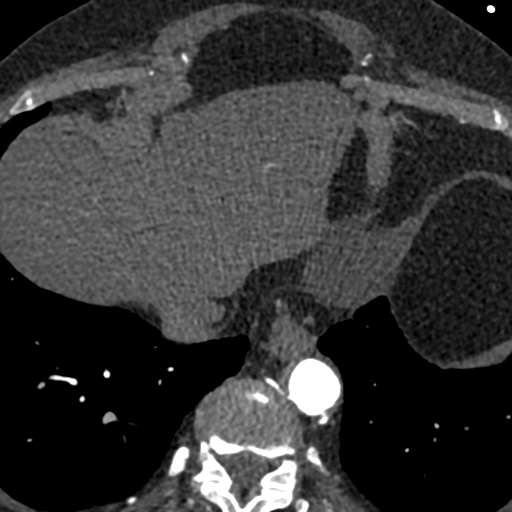
[im 150/448  lung]
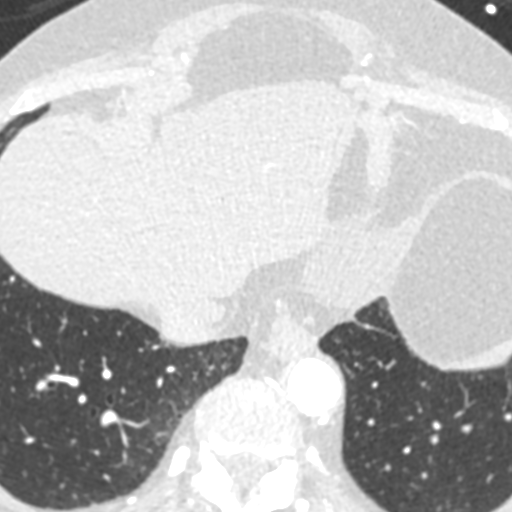
[im 299/448  vessel]
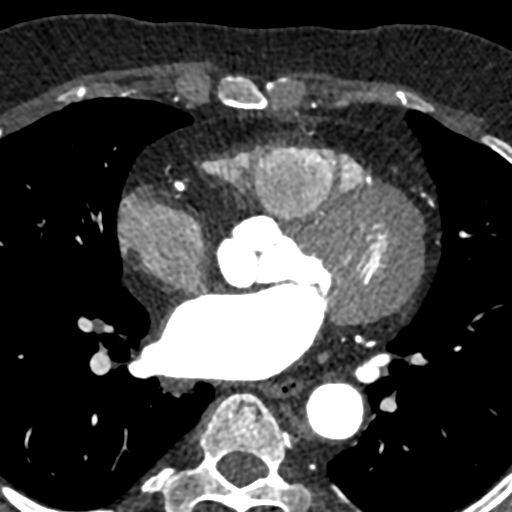

[Series 9: best syst 37 % · axial · 0.39mm/px · z∈[-28,+31]mm · 2 of 448 slices shown]
[im 150/448  vessel]
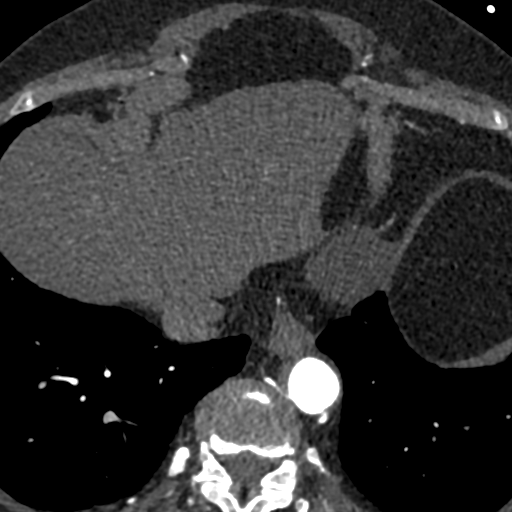
[im 299/448  vessel]
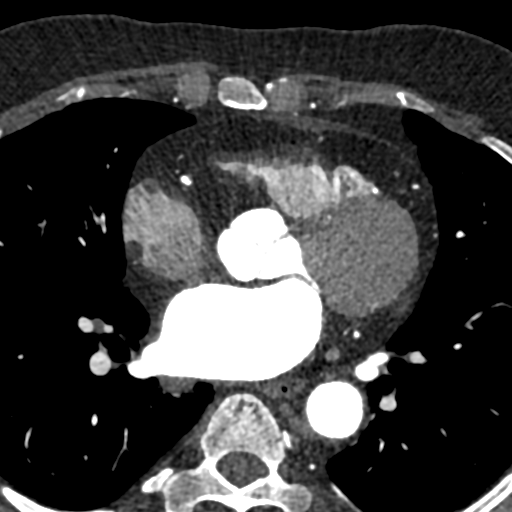

[Series 10: ts syst sharp 37 % · axial · 0.39mm/px · z∈[-28,+31]mm · 2 of 448 slices shown]
[im 150/448  lung]
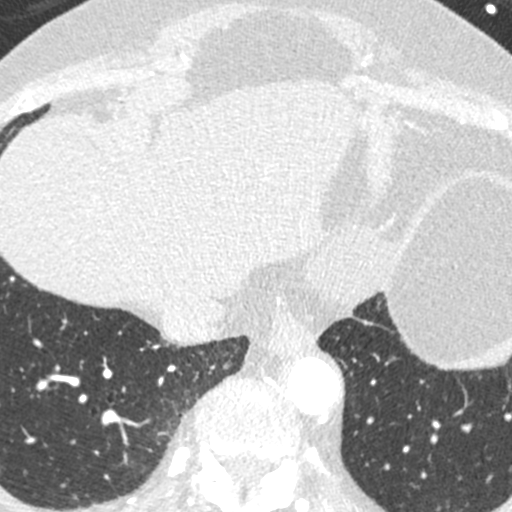
[im 299/448  lung]
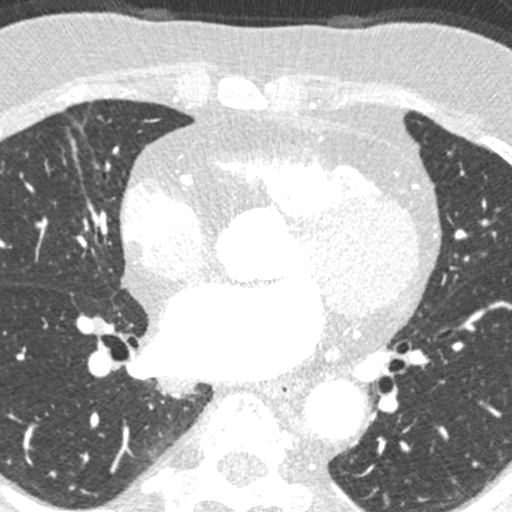

[Series 11: ts diast sharp 72 % · axial · 0.39mm/px · z∈[-28,+31]mm · 2 of 448 slices shown]
[im 150/448  lung]
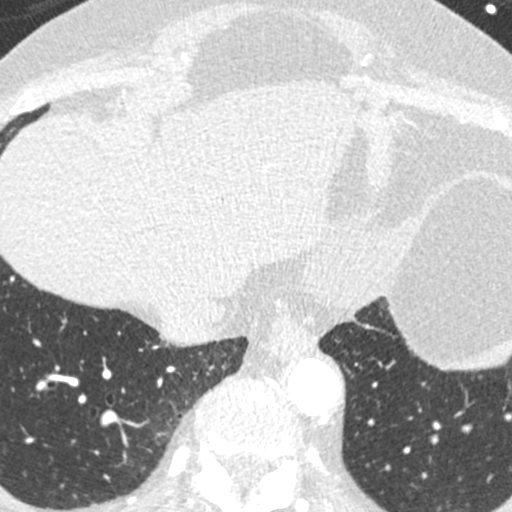
[im 299/448  lung]
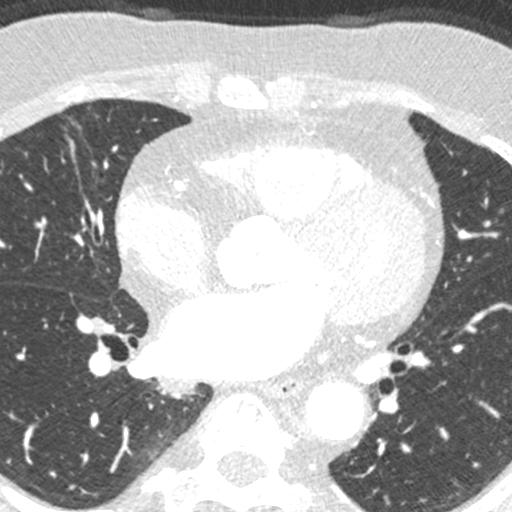

[8 of 20 positions shown; findings below may reference images not displayed]

FINDINGS: Image quality: Excellent.

Noise artifact is: Limited.

Coronary Arteries:  Normal coronary origin.  Right dominance.

Left main: The left main is a large caliber vessel with a normal
take off from the left coronary cusp that bifurcates to form a left
anterior descending artery and a left circumflex artery. There is
minimal calcified plaque in the distal LM with associated stenosis
of < 25%.

Left anterior descending artery: The LAD gives off 2 patent diagonal
branches. There is mild calcified plaque in the proximal LAD with
associated stenosis of 25-49%. There is moderate calcified plaque in
the mid LAD with associated stenosis of 50-69%.

Left circumflex artery: The LCX is non-dominant. There is mild to
moderate calcified plaque in the proximal LCx with associated
stenosis of up to 50-69%. The LCX gives off a large branching OM1.

Right coronary artery: The RCA is dominant with normal take off from
the right coronary cusp. The RCA terminates as a PDA and right
posterolateral branch. There is mild to moderate calcified plaque
throughout the proximal, mid and distal RCA including the PDA with
associated stenosis of at least 25-49% but may be as high as 50-69%
in some areas.

Right Atrium: Right atrial size is within normal limits.

Right Ventricle: The right ventricular cavity is within normal
limits.

Left Atrium: Left atrial size is normal in size with no left atrial
appendage filling defect.

Left Ventricle: The ventricular cavity size is within normal limits.
There are no stigmata of prior infarction. There is no abnormal
filling defect.

Pulmonary arteries: Normal in size without proximal filling defect.

Pulmonary veins: Normal pulmonary venous drainage.

Pericardium: Normal thickness with no significant effusion or
calcium present.

Cardiac valves: The aortic valve is trileaflet without significant
calcification. The mitral valve is normal structure without
significant calcification.

Aorta: Normal caliber with scattered aortic atherosclerosis.

Extra-cardiac findings: See attached radiology report for
non-cardiac structures.
IMPRESSION: 1. Coronary calcium score of 9535. This was 98th percentile for
age-, sex, and race-matched controls.

2.  Normal coronary origin with right dominance.

3.  Moderate atherosclerosis.  CAD RADS 3.

4. Consider symptom guided anti-ischemic and preventive
pharmacotherapy as well as risk factor modification per
guideline-directed care.

5.  This study has been submitted for FFR analysis.

RECOMMENDATIONS:
1. CAD-RADS 0: No evidence of CAD (0%). Consider non-atherosclerotic
causes of chest pain.

2. CAD-RADS 1: Minimal non-obstructive CAD (0-24%). Consider
non-atherosclerotic causes of chest pain. Consider preventive
therapy and risk factor modification.

3. CAD-RADS 2: Mild non-obstructive CAD (25-49%). Consider
non-atherosclerotic causes of chest pain. Consider preventive
therapy and risk factor modification.

4. CAD-RADS 3: Moderate stenosis. Consider symptom-guided
anti-ischemic pharmacotherapy as well as risk factor modification
per guideline directed care. Additional analysis with CT FFR will be
submitted.

5. CAD-RADS 4: Severe stenosis. (70-99% or > 50% left main). Cardiac
catheterization or CT FFR is recommended. Consider symptom-guided
anti-ischemic pharmacotherapy as well as risk factor modification
per guideline directed care. Invasive coronary angiography
recommended with revascularization per published guideline
statements.

6. CAD-RADS 5: Total coronary occlusion (100%). Consider cardiac
catheterization or viability assessment. Consider symptom-guided
anti-ischemic pharmacotherapy as well as risk factor modification
per guideline directed care.

7. CAD-RADS N: Non-diagnostic study. Obstructive CAD can't be
excluded. Alternative evaluation is recommended.

EXAM:
OVER-READ INTERPRETATION  CT CHEST

The following report is an over-read performed by radiologist Dr.
Bsili Tun [[email or web address]] on 02/20/2022. This over-read
does not include interpretation of cardiac or coronary anatomy or
pathology. The coronary CTA interpretation by the cardiologist is
attached.
FINDINGS: Vascular: Aortic atherosclerosis. No central pulmonary embolism, on
this non-dedicated study.

Mediastinum/Nodes: No imaged thoracic adenopathy.

Lungs/Pleura: No pleural fluid. Right lower lobe adjacent pulmonary
nodules of 2 and 3 mm including on [DATE].

Upper Abdomen: Moderate hepatic steatosis. Normal imaged portions of
the spleen, stomach.

Musculoskeletal: No acute osseous abnormality.
IMPRESSION: No acute findings in the imaged extracardiac chest.

Right lower lobe pulmonary nodules of up to 3 mm. No follow-up
needed if patient is low-risk. Non-contrast chest CT can be
considered in 12 months if patient is high-risk. This recommendation
follows the consensus statement: Guidelines for Management of
Incidental Pulmonary Nodules Detected on CT Images: From the

Aortic Atherosclerosis ([id number]).

Hepatic steatosis

*** End of Addendum ***
FINDINGS: Image quality: Excellent.

Noise artifact is: Limited.

Coronary Arteries:  Normal coronary origin.  Right dominance.

Left main: The left main is a large caliber vessel with a normal
take off from the left coronary cusp that bifurcates to form a left
anterior descending artery and a left circumflex artery. There is
minimal calcified plaque in the distal LM with associated stenosis
of < 25%.

Left anterior descending artery: The LAD gives off 2 patent diagonal
branches. There is mild calcified plaque in the proximal LAD with
associated stenosis of 25-49%. There is moderate calcified plaque in
the mid LAD with associated stenosis of 50-69%.

Left circumflex artery: The LCX is non-dominant. There is mild to
moderate calcified plaque in the proximal LCx with associated
stenosis of up to 50-69%. The LCX gives off a large branching OM1.

Right coronary artery: The RCA is dominant with normal take off from
the right coronary cusp. The RCA terminates as a PDA and right
posterolateral branch. There is mild to moderate calcified plaque
throughout the proximal, mid and distal RCA including the PDA with
associated stenosis of at least 25-49% but may be as high as 50-69%
in some areas.

Right Atrium: Right atrial size is within normal limits.

Right Ventricle: The right ventricular cavity is within normal
limits.

Left Atrium: Left atrial size is normal in size with no left atrial
appendage filling defect.

Left Ventricle: The ventricular cavity size is within normal limits.
There are no stigmata of prior infarction. There is no abnormal
filling defect.

Pulmonary arteries: Normal in size without proximal filling defect.

Pulmonary veins: Normal pulmonary venous drainage.

Pericardium: Normal thickness with no significant effusion or
calcium present.

Cardiac valves: The aortic valve is trileaflet without significant
calcification. The mitral valve is normal structure without
significant calcification.

Aorta: Normal caliber with scattered aortic atherosclerosis.

Extra-cardiac findings: See attached radiology report for
non-cardiac structures.
IMPRESSION: 1. Coronary calcium score of 9535. This was 98th percentile for
age-, sex, and race-matched controls.

2.  Normal coronary origin with right dominance.

3.  Moderate atherosclerosis.  CAD RADS 3.

4. Consider symptom guided anti-ischemic and preventive
pharmacotherapy as well as risk factor modification per
guideline-directed care.

5.  This study has been submitted for FFR analysis.

RECOMMENDATIONS:
1. CAD-RADS 0: No evidence of CAD (0%). Consider non-atherosclerotic
causes of chest pain.

2. CAD-RADS 1: Minimal non-obstructive CAD (0-24%). Consider
non-atherosclerotic causes of chest pain. Consider preventive
therapy and risk factor modification.

3. CAD-RADS 2: Mild non-obstructive CAD (25-49%). Consider
non-atherosclerotic causes of chest pain. Consider preventive
therapy and risk factor modification.

4. CAD-RADS 3: Moderate stenosis. Consider symptom-guided
anti-ischemic pharmacotherapy as well as risk factor modification
per guideline directed care. Additional analysis with CT FFR will be
submitted.

5. CAD-RADS 4: Severe stenosis. (70-99% or > 50% left main). Cardiac
catheterization or CT FFR is recommended. Consider symptom-guided
anti-ischemic pharmacotherapy as well as risk factor modification
per guideline directed care. Invasive coronary angiography
recommended with revascularization per published guideline
statements.

6. CAD-RADS 5: Total coronary occlusion (100%). Consider cardiac
catheterization or viability assessment. Consider symptom-guided
anti-ischemic pharmacotherapy as well as risk factor modification
per guideline directed care.

7. CAD-RADS N: Non-diagnostic study. Obstructive CAD can't be
excluded. Alternative evaluation is recommended.

## 2024-02-13 ENCOUNTER — Other Ambulatory Visit: Payer: Self-pay | Admitting: Internal Medicine

## 2024-02-28 NOTE — Progress Notes (Unsigned)
 Cardiology Office Note    Patient Name: Shirley Jackson Date of Encounter: 02/28/2024  Primary Care Provider:  Clovis Riley, L.August Saucer, MD (Inactive) Primary Cardiologist:  Orbie Pyo, MD Primary Electrophysiologist: None   Past Medical History    No past medical history on file.  History of Present Illness  Shirley Jackson is a 78 y.o. 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  Ms. Christian was seen initially by Dr. Lynnette Caffey on 02/06/2022 by referral of PCP for complaint of chest pain.  She also endorsed shortness of breath with exertion and EKG was completed showing nonspecific ST and T wave changes.  She completed a previous Lexiscan in 2003 for chest pain that was normal and completed a carotid ultrasound in 2022 showing bilateral carotid atherosclerosis with no stenosis.  She underwent a coronary CTA with calcium score of 1629 and moderate atherosclerosis with FFR completed showing probable flow-limiting lesion in the proximal RCA.  She was seen in follow-up on 08/24/2022 and denied any exertional angina.  She did endorse an episode of right shoulder pain from arthritis.  She was continued on medical therapy and given a prescription for nitroglycerin.  She was last seen on 02/20/2023 and reported doing well and was walking daily without any exertional angina.  She did endorse occasional atypical chest pain that was related to food and sometimes takes as needed nitroglycerin which gives relief.  She endorsed taking 4 times in the past 6 months.  She was found to have well-controlled blood pressures at that time and underwent check of LP(a) that was normal.  Patient denies chest pain, palpitations, dyspnea, PND, orthopnea, nausea, vomiting, dizziness, syncope, edema, weight gain, or early satiety.   Discussed the use of AI scribe software for clinical note  transcription with the patient, who gave verbal consent to proceed.  History of Present Illness    ***Notes: -Last ischemic evaluation:  Review of Systems  Please see the history of present illness.    All other systems reviewed and are otherwise negative except as noted above.  Physical Exam    Wt Readings from Last 3 Encounters:  02/20/23 157 lb 9.6 oz (71.5 kg)  08/24/22 158 lb 9.6 oz (71.9 kg)  02/06/22 162 lb (73.5 kg)   [id number] were no vitals filed for this visit.,There is no height or weight on file to calculate BMI. GEN: Well nourished, well developed in no acute distress Neck: No JVD; No carotid bruits Pulmonary: Clear to auscultation without rales, wheezing or rhonchi  Cardiovascular: Normal rate. Regular rhythm. Normal S1. Normal S2.   Murmurs: There is no murmur.  ABDOMEN: Soft, non-tender, non-distended EXTREMITIES:  No edema; No deformity   EKG/LABS/ Recent Cardiac Studies   ECG personally reviewed by me today - ***  Risk Assessment/Calculations:   {Does this patient have ATRIAL FIBRILLATION?:[phone]}      No results found for: "WBC", "HGB", "HCT", "MCV", "PLT" No results found for: "CREATININE", "BUN", "NA", "K", "CL", "CO2" Lab Results  Component Value Date   CHOL 109 02/20/2023   HDL 52 02/20/2023   LDLCALC 31 02/20/2023   TRIG 158 (H) 02/20/2023   CHOLHDL 2.1 02/20/2023    No results found for: "HGBA1C" Assessment & Plan    1.  CAD:  2.  HTN:  3.  HLD:  4.  CKD stage III:  5.  Aortic atherosclerosis:  Disposition: Follow-up with Orbie Pyo, MD or APP in *** months {Are you ordering a CV Procedure (e.g. stress test, cath, DCCV, TEE, etc)?   Press F2        :[id number]}   Signed, Napoleon Form, Leodis Rains, NP 02/28/2024, 12:48 PM [facility]

## 2024-02-29 ENCOUNTER — Encounter (HOSPITAL_COMMUNITY): Payer: Self-pay

## 2024-02-29 ENCOUNTER — Ambulatory Visit: Attending: Nurse Practitioner | Admitting: Nurse Practitioner

## 2024-02-29 ENCOUNTER — Encounter: Payer: Self-pay | Admitting: Nurse Practitioner

## 2024-02-29 VITALS — BP 132/78 | HR 76 | Ht 67.5 in | Wt 156.2 lb

## 2024-02-29 DIAGNOSIS — R079 Chest pain, unspecified: Secondary | ICD-10-CM

## 2024-02-29 DIAGNOSIS — N1831 Chronic kidney disease, stage 3a: Secondary | ICD-10-CM | POA: Diagnosis not present

## 2024-02-29 DIAGNOSIS — I10 Essential (primary) hypertension: Secondary | ICD-10-CM | POA: Diagnosis not present

## 2024-02-29 DIAGNOSIS — E785 Hyperlipidemia, unspecified: Secondary | ICD-10-CM | POA: Diagnosis not present

## 2024-02-29 DIAGNOSIS — I700 Atherosclerosis of aorta: Secondary | ICD-10-CM

## 2024-02-29 DIAGNOSIS — I2510 Atherosclerotic heart disease of native coronary artery without angina pectoris: Secondary | ICD-10-CM | POA: Diagnosis not present

## 2024-02-29 DIAGNOSIS — M1990 Unspecified osteoarthritis, unspecified site: Secondary | ICD-10-CM

## 2024-02-29 MED ORDER — NITROGLYCERIN 0.4 MG SL SUBL: 0.4000 mg | SUBLINGUAL_TABLET | SUBLINGUAL | 1 refills | Status: AC | PRN

## 2024-02-29 NOTE — Patient Instructions (Signed)
 Medication Instructions:  Your physician recommends that you continue on your current medications as directed. Please refer to the Current Medication list given to you today. *If you need a refill on your cardiac medications before your next appointment, please call your pharmacy*  Lab Work: None ordered If you have labs (blood work) drawn today and your tests are completely normal, you will receive your results only by: MyChart Message (if you have MyChart) OR A paper copy in the mail If you have any lab test that is abnormal or we need to change your treatment, we will call you to review the results.  Testing/Procedures: Your physician has requested that you have an exercise stress myoview. For further information please visit [email or web address] Please follow instruction sheet, as given.  Your physician has requested that you have an echocardiogram. Echocardiography is a painless test that uses sound waves to create images of your heart. It provides your doctor with information about the size and shape of your heart and how well your heart's chambers and valves are working. This procedure takes approximately one hour. There are no restrictions for this procedure. Please do NOT wear cologne, perfume, aftershave, or lotions (deodorant is allowed). Please arrive 15 minutes prior to your appointment time.  Please note: We ask at that you not bring children with you during ultrasound (echo/ vascular) testing. Due to room size and safety concerns, children are not allowed in the ultrasound rooms during exams. Our front office staff cannot provide observation of children in our lobby area while testing is being conducted. An adult accompanying a patient to their appointment will only be allowed in the ultrasound room at the discretion of the ultrasound technician under special circumstances. We apologize for any inconvenience.  Follow-Up: At [facility], you and your health needs are our  priority.  As part of our continuing mission to provide you with exceptional heart care, our providers are all part of one team.  This team includes your primary Cardiologist (physician) and Advanced Practice Providers or APPs (Physician Assistants and Nurse Practitioners) who all work together to provide you with the care you need, when you need it.  Your next appointment:   3 month(s)  Provider:   Robin Searing, NP       We recommend signing up for the patient portal called "MyChart".  Sign up information is provided on this After Visit Summary.  MyChart is used to connect with patients for Virtual Visits (Telemedicine).  Patients are able to view lab/test results, encounter notes, upcoming appointments, etc.  Non-urgent messages can be sent to your provider as well.   To learn more about what you can do with MyChart, go to [email or web address].   Other Instructions       1st Floor: - Lobby - Registration  - Pharmacy  - Lab - Cafe  2nd Floor: - PV Lab - Diagnostic Testing (echo, CT, nuclear med)  3rd Floor: - Vacant  4th Floor: - TCTS (cardiothoracic surgery) - AFib Clinic - Structural Heart Clinic - Vascular Surgery  - Vascular Ultrasound  5th Floor: - HeartCare Cardiology (general and EP) - Clinical Pharmacy for coumadin, hypertension, lipid, weight-loss medications, and med management appointments    Valet parking services will be available as well.

## 2024-03-05 ENCOUNTER — Telehealth (HOSPITAL_COMMUNITY): Payer: Self-pay | Admitting: *Deleted

## 2024-03-05 NOTE — Telephone Encounter (Signed)
 Spoke to patient and she stated that she has the detailed instructions and where to come for her test on 03/07/24 at 12:45.

## 2024-03-07 ENCOUNTER — Ambulatory Visit (HOSPITAL_COMMUNITY): Attending: Nurse Practitioner

## 2024-03-07 DIAGNOSIS — E785 Hyperlipidemia, unspecified: Secondary | ICD-10-CM | POA: Diagnosis present

## 2024-03-07 DIAGNOSIS — I10 Essential (primary) hypertension: Secondary | ICD-10-CM | POA: Diagnosis present

## 2024-03-07 DIAGNOSIS — I2510 Atherosclerotic heart disease of native coronary artery without angina pectoris: Secondary | ICD-10-CM | POA: Insufficient documentation

## 2024-03-07 DIAGNOSIS — I700 Atherosclerosis of aorta: Secondary | ICD-10-CM | POA: Diagnosis present

## 2024-03-07 DIAGNOSIS — N1831 Chronic kidney disease, stage 3a: Secondary | ICD-10-CM | POA: Insufficient documentation

## 2024-03-07 LAB — MYOCARDIAL PERFUSION IMAGING
Angina Index: 0
Duke Treadmill Score: 4
Estimated workload: 4.6
Exercise duration (min): 3 min
Exercise duration (sec): 45 s
LV dias vol: 47 mL (ref 46–106)
LV sys vol: 14 mL
MPHR: 143 {beats}/min
Nuc Stress EF: 69 %
Peak HR: 134 {beats}/min
Percent HR: 93 %
Rest HR: 76 {beats}/min
Rest Nuclear Isotope Dose: 10.2 mCi
SDS: 3
SRS: 0
SSS: 3
ST Depression (mm): 0 mm
Stress Nuclear Isotope Dose: 31.4 mCi
TID: 1.01

## 2024-03-07 MED ORDER — TECHNETIUM TC 99M TETROFOSMIN IV KIT
31.4000 | PACK | Freq: Once | INTRAVENOUS | Status: AC | PRN
Start: 2024-03-07 — End: 2024-03-07
  Administered 2024-03-07: 31.4 via INTRAVENOUS

## 2024-03-07 MED ORDER — TECHNETIUM TC 99M TETROFOSMIN IV KIT
10.2000 | PACK | Freq: Once | INTRAVENOUS | Status: AC | PRN
Start: 2024-03-07 — End: 2024-03-07
  Administered 2024-03-07: 10.2 via INTRAVENOUS

## 2024-03-14 ENCOUNTER — Telehealth (HOSPITAL_COMMUNITY): Payer: Self-pay | Admitting: Nurse Practitioner

## 2024-03-14 NOTE — Telephone Encounter (Signed)
 Patient cancelled echocardiogram for the reason below:  03/07/2024 2:57 PM [id number], Shirley Jackson  Cancel Rsn: Patient   Order will be removed from the active echo WQ. If patient calls back to reschedule we will reinstate the order. Thank you.

## 2024-04-01 ENCOUNTER — Other Ambulatory Visit (HOSPITAL_COMMUNITY)

## 2024-04-15 ENCOUNTER — Encounter: Payer: Self-pay | Admitting: Nurse Practitioner

## 2024-05-09 ENCOUNTER — Other Ambulatory Visit: Payer: Self-pay | Admitting: Internal Medicine

## 2024-05-26 ENCOUNTER — Ambulatory Visit: Admitting: Nurse Practitioner
